# Patient Record
Sex: Male | Born: 1960 | Race: White | Hispanic: No | Marital: Married | Smoking: Current every day smoker
Health system: Southern US, Community
[De-identification: ages and names within clinical notes are randomized; demographics above are authoritative.]

---

## 2005-05-06 ENCOUNTER — Inpatient Hospital Stay (HOSPITAL_COMMUNITY): Admission: AC | Admit: 2005-05-06 | Discharge: 2005-05-08 | Payer: Self-pay

## 2011-02-11 ENCOUNTER — Inpatient Hospital Stay (INDEPENDENT_AMBULATORY_CARE_PROVIDER_SITE_OTHER)
Admission: RE | Admit: 2011-02-11 | Discharge: 2011-02-11 | Disposition: A | Discharge status: Home / Self Care | Payer: Commercial Managed Care - PPO | Source: Ambulatory Visit | Attending: Emergency Medicine | Admitting: Emergency Medicine

## 2011-02-11 ENCOUNTER — Ambulatory Visit (INDEPENDENT_AMBULATORY_CARE_PROVIDER_SITE_OTHER): Payer: Commercial Managed Care - PPO

## 2011-02-11 DIAGNOSIS — R109 Unspecified abdominal pain: Secondary | ICD-10-CM

## 2011-02-11 LAB — POCT URINALYSIS DIP (DEVICE)
Bilirubin Urine: NEGATIVE
Glucose, UA: NEGATIVE mg/dL
Ketones, ur: NEGATIVE mg/dL
Protein, ur: NEGATIVE mg/dL
Specific Gravity, Urine: <=1.005 (ref 1.005–1.030)
Urobilinogen, UA: 0.2 mg/dL (ref 0.0–1.0)
pH: 6.5 (ref 5.0–8.0)

## 2011-12-01 IMAGING — CR DG RIBS W/ CHEST 3+V*L*
4 series · 4 of 4 positions shown · non-contrast
Comparison: Chest x-ray 05/07/2005.

CLINICAL DATA: Chest pain.

LEFT RIBS AND CHEST - 3+ VIEW

[view not recorded (1 of 4)]
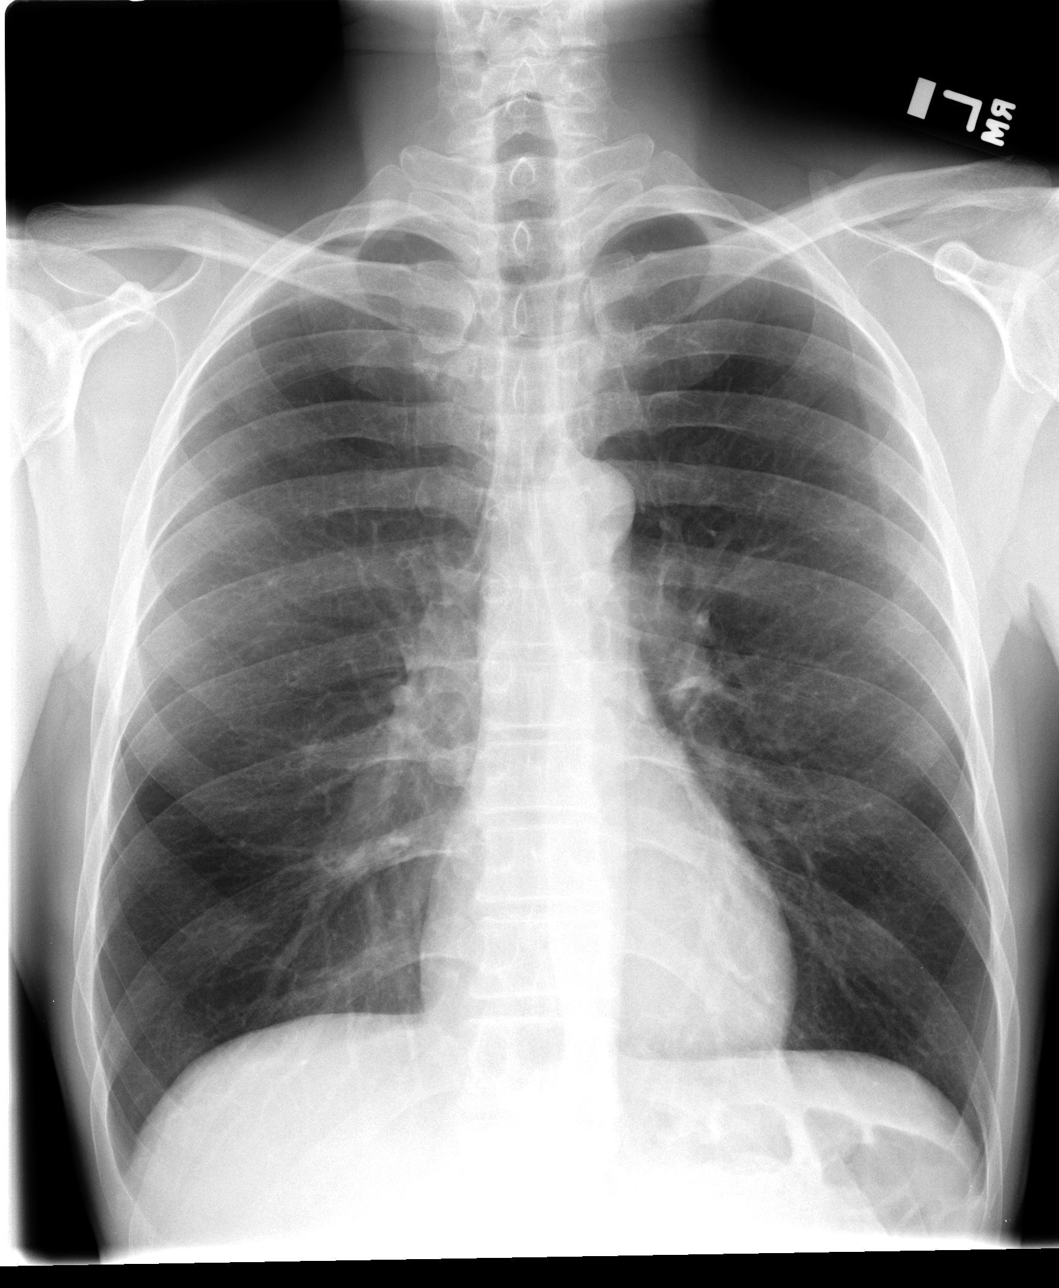

[view not recorded (2 of 4)]
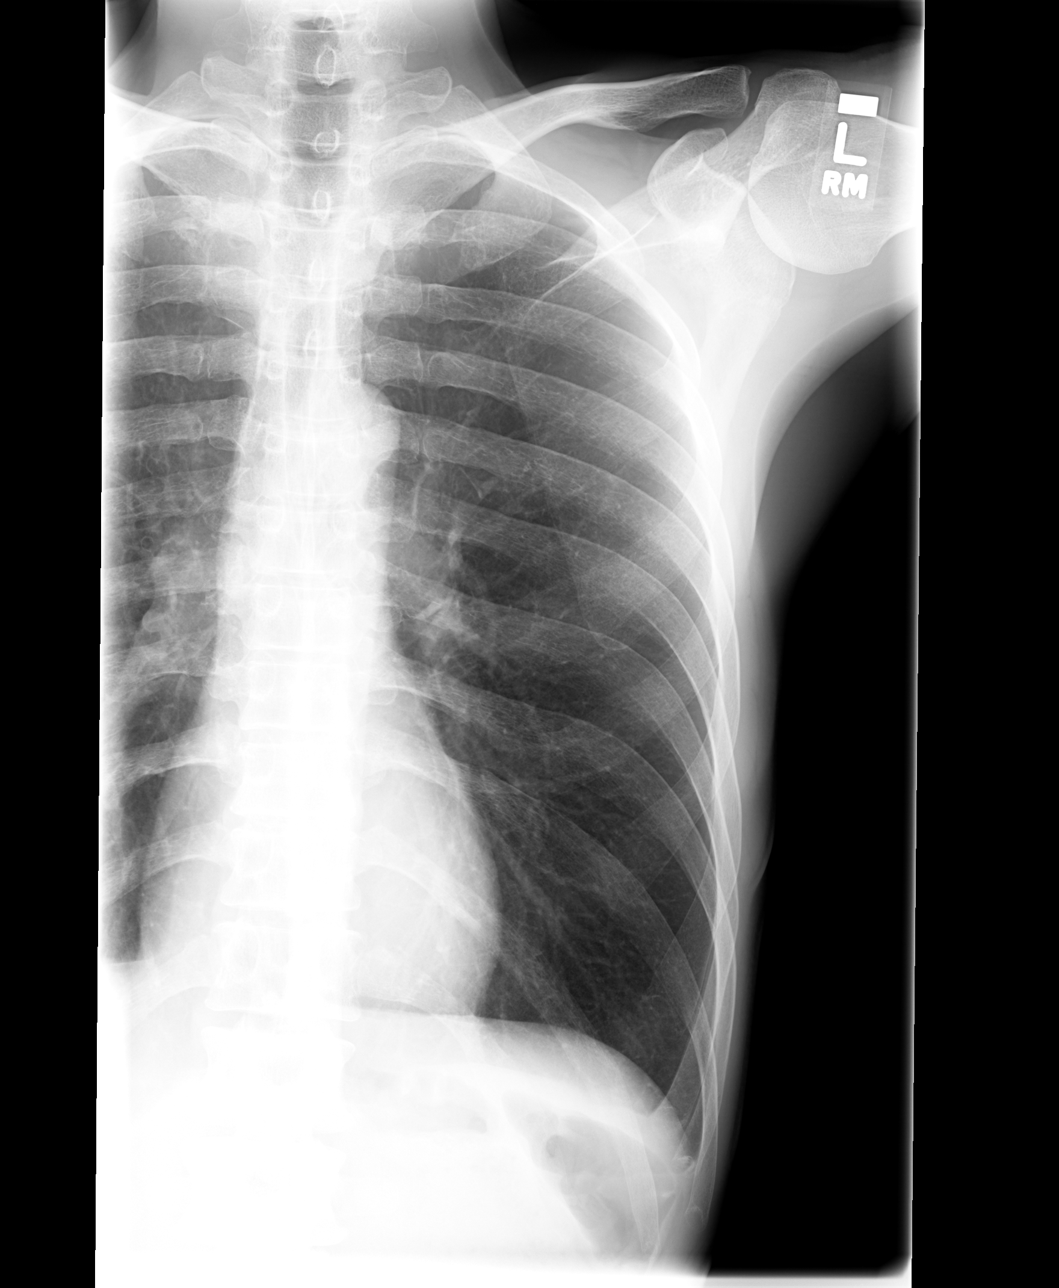

[view not recorded (3 of 4)]
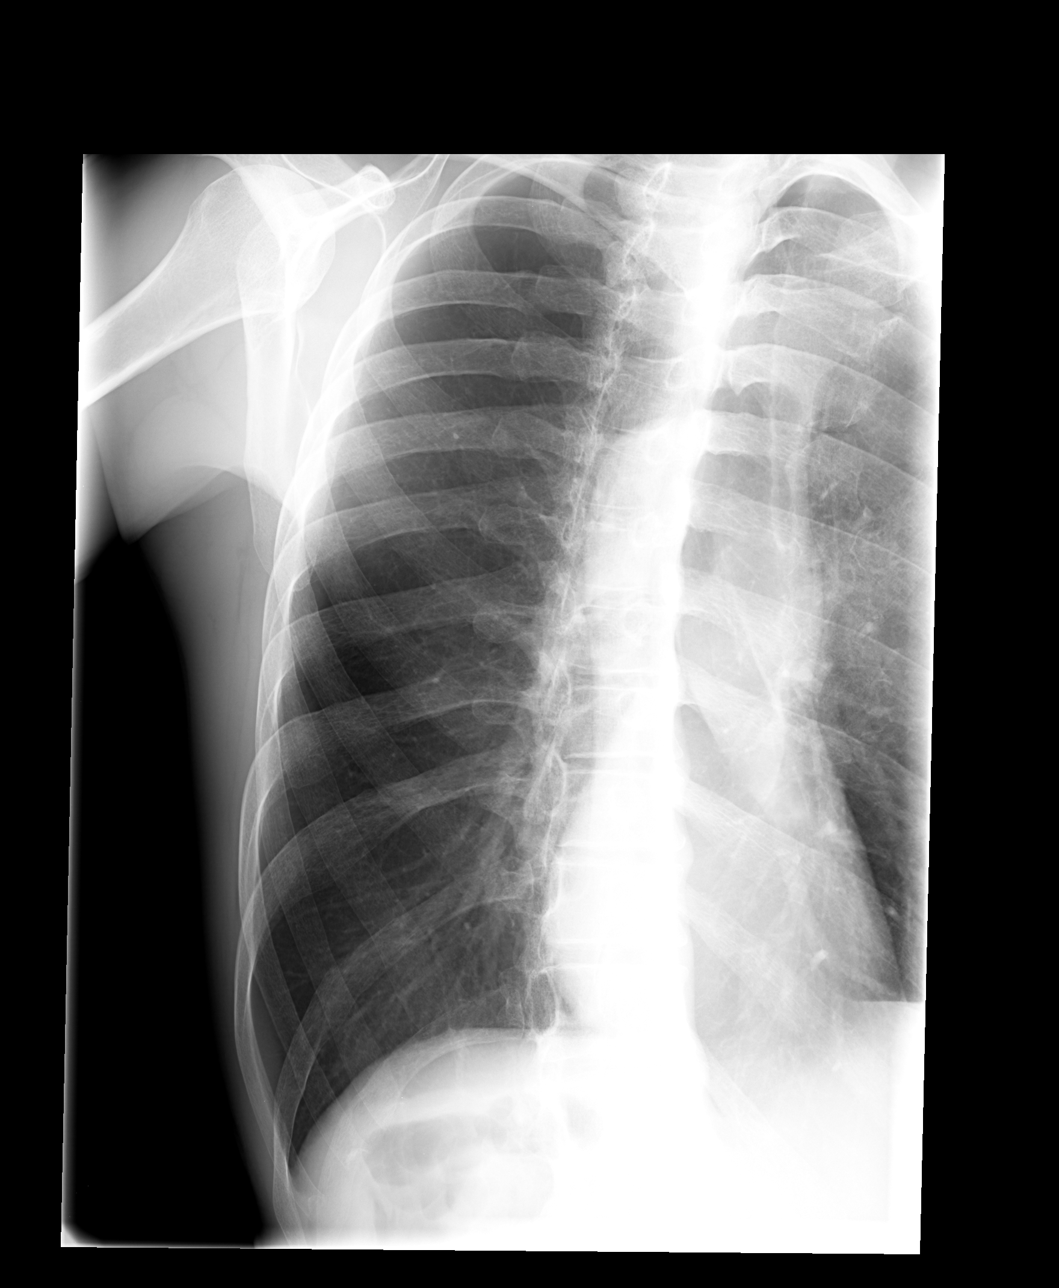

[view not recorded (4 of 4)]
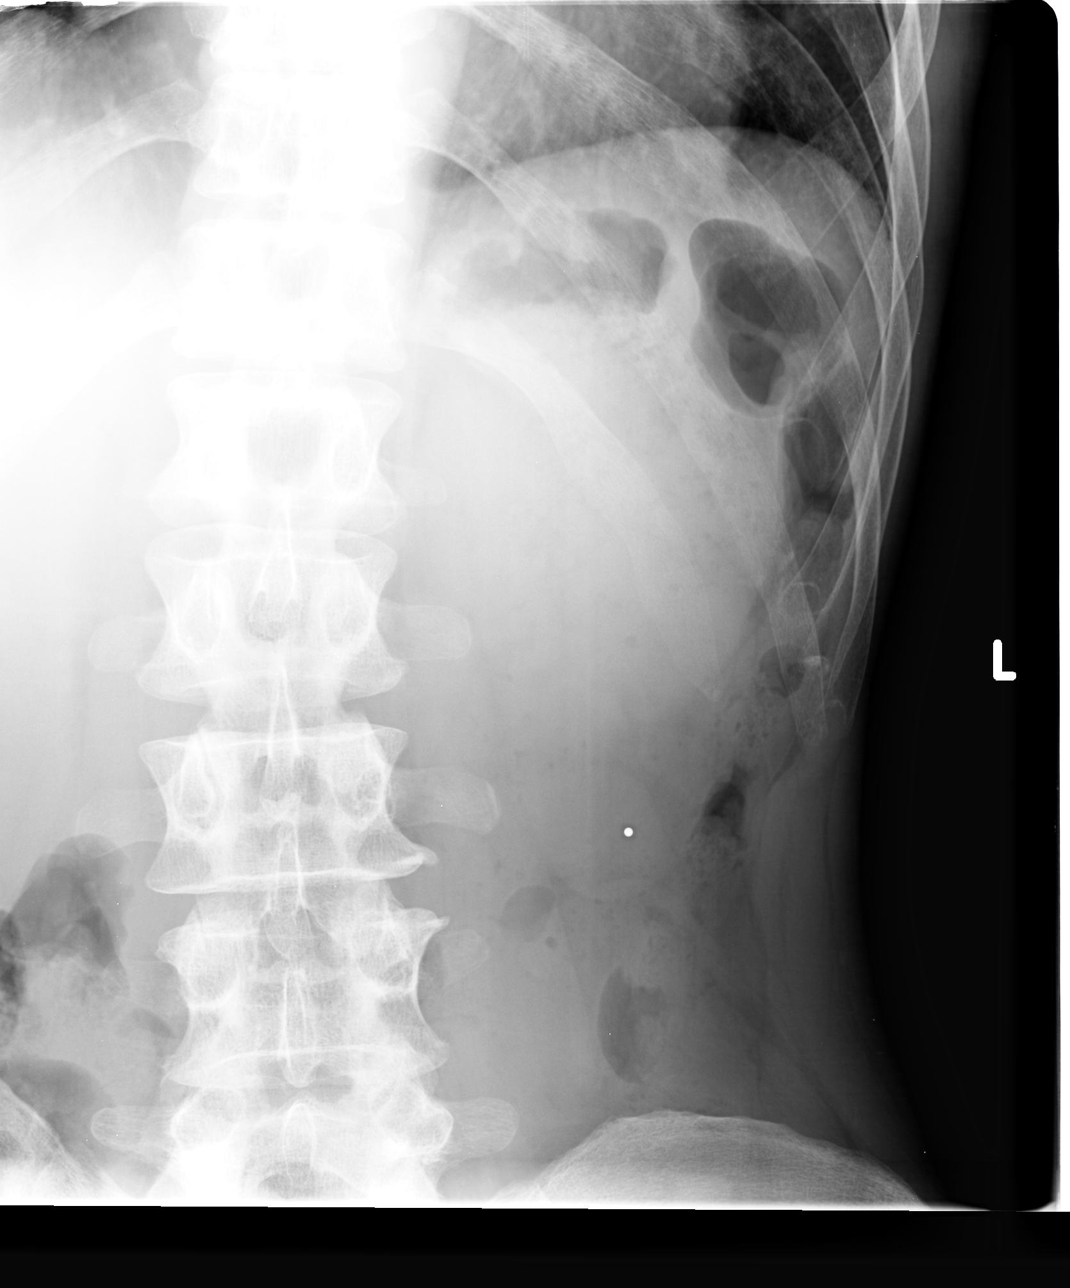

[4 of 4 positions shown; findings below may reference images not displayed]

FINDINGS: The cardiac silhouette, mediastinal and hilar contours
are within normal limits.  The lungs are clear.

Dedicated views of the left ribs demonstrate no definite acute rib
fractures.  No pleural thickening, pleural effusion or
pneumothorax.
IMPRESSION: 1.  No acute cardiopulmonary findings.
2.  No definite left-sided rib fractures.

## 2013-08-14 ENCOUNTER — Emergency Department (INDEPENDENT_AMBULATORY_CARE_PROVIDER_SITE_OTHER): Payer: PRIVATE HEALTH INSURANCE

## 2013-08-14 ENCOUNTER — Emergency Department (HOSPITAL_COMMUNITY)
Admission: EM | Admit: 2013-08-14 | Discharge: 2013-08-14 | Disposition: A | Discharge status: Home / Self Care | Payer: PRIVATE HEALTH INSURANCE | Source: Home / Self Care | Attending: Family Medicine | Admitting: Family Medicine

## 2013-08-14 ENCOUNTER — Encounter (HOSPITAL_COMMUNITY): Payer: Self-pay | Admitting: Emergency Medicine

## 2013-08-14 ENCOUNTER — Emergency Department (HOSPITAL_COMMUNITY): Payer: PRIVATE HEALTH INSURANCE

## 2013-08-14 DIAGNOSIS — J40 Bronchitis, not specified as acute or chronic: Secondary | ICD-10-CM

## 2013-08-14 MED ORDER — IPRATROPIUM-ALBUTEROL 0.5-2.5 (3) MG/3ML IN SOLN
3.0000 mL | Freq: Once | RESPIRATORY_TRACT | Status: AC
  Administered 2013-08-14: 3 mL via RESPIRATORY_TRACT

## 2013-08-14 MED ORDER — ALBUTEROL SULFATE HFA 108 (90 BASE) MCG/ACT IN AERS
INHALATION_SPRAY | RESPIRATORY_TRACT | Status: AC
  Filled 2013-08-14: qty 6.7

## 2013-08-14 MED ORDER — ACETAMINOPHEN 325 MG PO TABS
ORAL_TABLET | ORAL | Status: AC
  Filled 2013-08-14: qty 2

## 2013-08-14 MED ORDER — ACETAMINOPHEN 325 MG PO TABS
650.0000 mg | ORAL_TABLET | Freq: Once | ORAL | Status: AC
  Administered 2013-08-14: 650 mg via ORAL

## 2013-08-14 MED ORDER — ALBUTEROL SULFATE HFA 108 (90 BASE) MCG/ACT IN AERS: 2.0000 | INHALATION_SPRAY | Freq: Four times a day (QID) | RESPIRATORY_TRACT | Status: AC | PRN

## 2013-08-14 MED ORDER — GUAIFENESIN-CODEINE 100-10 MG/5ML PO SOLN: 5.0000 mL | Freq: Every evening | ORAL | Status: AC | PRN

## 2013-08-14 MED ORDER — IPRATROPIUM BROMIDE 0.06 % NA SOLN: 2.0000 | Freq: Four times a day (QID) | NASAL | Status: AC

## 2013-08-14 MED ORDER — IPRATROPIUM-ALBUTEROL 0.5-2.5 (3) MG/3ML IN SOLN
RESPIRATORY_TRACT | Status: AC
  Filled 2013-08-14: qty 3

## 2013-08-14 MED ORDER — ALBUTEROL SULFATE HFA 108 (90 BASE) MCG/ACT IN AERS
1.0000 | INHALATION_SPRAY | Freq: Four times a day (QID) | RESPIRATORY_TRACT | Status: DC | PRN
  Administered 2013-08-14: 2 via RESPIRATORY_TRACT

## 2013-08-14 MED ORDER — PREDNISONE 50 MG PO TABS: 50.0000 mg | ORAL_TABLET | Freq: Every day | ORAL | Status: AC

## 2013-08-14 NOTE — ED Notes (Signed)
Dr. Denyse Amassorey is in the room w/the pt C/o cold sxs onset yest w/sxs that include: fevers, scratchy throat, nauseas, HA, BA Denies: SOB, wheezing, d/v He is alert w/no signs of acute distress.

## 2013-08-14 NOTE — ED Provider Notes (Signed)
Isaac Knox is a 53 y.o. male who presents to Urgent Care today for one day of fever sore throat nausea headache cough and congestion. Patient also noted some moderate body aches. He notes positive sick contacts at work. He denies any significant shortness of breath or wheezing. He is a heavy smoker but denies any history of COPD or asthma. He's tried some over-the-counter medications which have helped a bit.   History reviewed. No pertinent past medical history. History  Substance Use Topics  . Smoking status: Current Every Day Smoker -- 1.00 packs/day    Types: Cigarettes  . Smokeless tobacco: Not on file  . Alcohol Use: Yes   ROS as above Medications: Current Facility-Administered Medications  Medication Dose Route Frequency Provider Last Rate Last Dose  . albuterol (PROVENTIL HFA;VENTOLIN HFA) 108 (90 BASE) MCG/ACT inhaler 1-2 puff  1-2 puff Inhalation Q6H PRN Rodolph BongEvan S Shadawn Hanaway, MD   2 puff at 08/14/13 1626   Current Outpatient Prescriptions  Medication Sig Dispense Refill  . albuterol (PROVENTIL HFA;VENTOLIN HFA) 108 (90 BASE) MCG/ACT inhaler Inhale 2 puffs into the lungs every 6 (six) hours as needed for wheezing or shortness of breath.  1 Inhaler  2  . guaiFENesin-codeine 100-10 MG/5ML syrup Take 5 mLs by mouth at bedtime as needed for cough.  120 mL  0  . ipratropium (ATROVENT) 0.06 % nasal spray Place 2 sprays into both nostrils 4 (four) times daily.  15 mL  1  . predniSONE (DELTASONE) 50 MG tablet Take 1 tablet (50 mg total) by mouth daily.  5 tablet  0    Exam:  BP 111/69  Pulse 90  Temp(Src) 98.5 F (36.9 C) (Oral)  Resp 18  SpO2 96% Gen: Well NAD HEENT: EOMI,  MMM Lungs: Normal work of breathing. Crackles right lower lobe clear otherwise Heart: RRR no MRG Abd: NABS, Soft. NT, ND Exts: Brisk capillary refill, warm and well perfused.   Patient was given a DuoNeb nebulizer treatment and had improvement in symptoms.  No results found for this or any previous visit  (from the past 24 hour(s)). Dg Chest 2 View  08/14/2013   CLINICAL DATA:  Cough and fever.  EXAM: CHEST  2 VIEW  COMPARISON:  01/2011  FINDINGS: Heart, mediastinum and hila are unremarkable.  Lungs are hyperexpanded but clear. No pleural effusion or pneumothorax.  The bony thorax is intact.  IMPRESSION: No acute cardiopulmonary disease. Stable appearance from the prior exam.   Electronically Signed   By: Amie Portlandavid  Ormond M.D.   On: 08/14/2013 16:06    Assessment and Plan: 53 y.o. male with bronchitis versus COPD exacerbation. Plan to treat with prednisone, codeine containing cough medication, and Atrovent nasal spray. Additionally we'll use an albuterol inhaler. Encourage patient to quit smoking. Followup with primary care provider.  Discussed warning signs or symptoms. Please see discharge instructions. Patient expresses understanding.    Rodolph BongEvan S Jayson Waterhouse, MD 08/14/13 [phone]

## 2013-08-14 NOTE — Discharge Instructions (Signed)
Thank you for coming in today. Take prednisone daily for 5 days. This is the most important medication.  Use the albuterol inhaler as needed for shortness of breath or wheezing. Take codeine containing cough medication especially at bedtime for cough suppression. Do not drive or work after taking this medicine.  Use the nasal spray as needed for runny nose or congestion.  Take up to 2 Aleve twice daily for pain fevers chills body aches or sore throat. Call or go to the emergency room if you get worse, have trouble breathing, have chest pains, or palpitations.  Followup with your Dr..  Please try to quit smoking  Bronchitis Bronchitis is inflammation of the airways that extend from the windpipe into the lungs (bronchi). The inflammation often causes mucus to develop, which leads to a cough. If the inflammation becomes severe, it may cause shortness of breath. CAUSES  Bronchitis may be caused by:   Viral infections.   Bacteria.   Cigarette smoke.   Allergens, pollutants, and other irritants.  SIGNS AND SYMPTOMS  The most common symptom of bronchitis is a frequent cough that produces mucus. Other symptoms include:  Fever.   Body aches.   Chest congestion.   Chills.   Shortness of breath.   Sore throat.  DIAGNOSIS  Bronchitis is usually diagnosed through a medical history and physical exam. Tests, such as chest X-rays, are sometimes done to rule out other conditions.  TREATMENT  You may need to avoid contact with whatever caused the problem (smoking, for example). Medicines are sometimes needed. These may include:  Antibiotics. These may be prescribed if the condition is caused by bacteria.  Cough suppressants. These may be prescribed for relief of cough symptoms.   Inhaled medicines. These may be prescribed to help open your airways and make it easier for you to breathe.   Steroid medicines. These may be prescribed for those with recurrent (chronic)  bronchitis. HOME CARE INSTRUCTIONS  Get plenty of rest.   Drink enough fluids to keep your urine clear or pale yellow (unless you have a medical condition that requires fluid restriction). Increasing fluids may help thin your secretions and will prevent dehydration.   Only take over-the-counter or prescription medicines as directed by your health care provider.  Only take antibiotics as directed. Make sure you finish them even if you start to feel better.  Avoid secondhand smoke, irritating chemicals, and strong fumes. These will make bronchitis worse. If you are a smoker, quit smoking. Consider using nicotine gum or skin patches to help control withdrawal symptoms. Quitting smoking will help your lungs heal faster.   Put a cool-mist humidifier in your bedroom at night to moisten the air. This may help loosen mucus. Change the water in the humidifier daily. You can also run the hot water in your shower and sit in the bathroom with the door closed for 5 10 minutes.   Follow up with your health care provider as directed.   Wash your hands frequently to avoid catching bronchitis again or spreading an infection to others.  SEEK MEDICAL CARE IF: Your symptoms do not improve after 1 week of treatment.  SEEK IMMEDIATE MEDICAL CARE IF:  Your fever increases.  You have chills.   You have chest pain.   You have worsening shortness of breath.   You have bloody sputum.  You faint.  You have lightheadedness.  You have a severe headache.   You vomit repeatedly. MAKE SURE YOU:   Understand these instructions.  Will watch your condition.  Will get help right away if you are not doing well or get worse. Document Released: 07/11/2005 Document Revised: 05/01/2013 Document Reviewed: 03/05/2013 [facility] Patient Information 2014 [place], [place].

## 2014-06-03 IMAGING — CR DG CHEST 2V
3 series · 3 of 3 positions shown · non-contrast
Comparison: [DATE]

CLINICAL DATA: Cough and fever.

EXAM:
CHEST  2 VIEW

[view not recorded (1 of 3)]
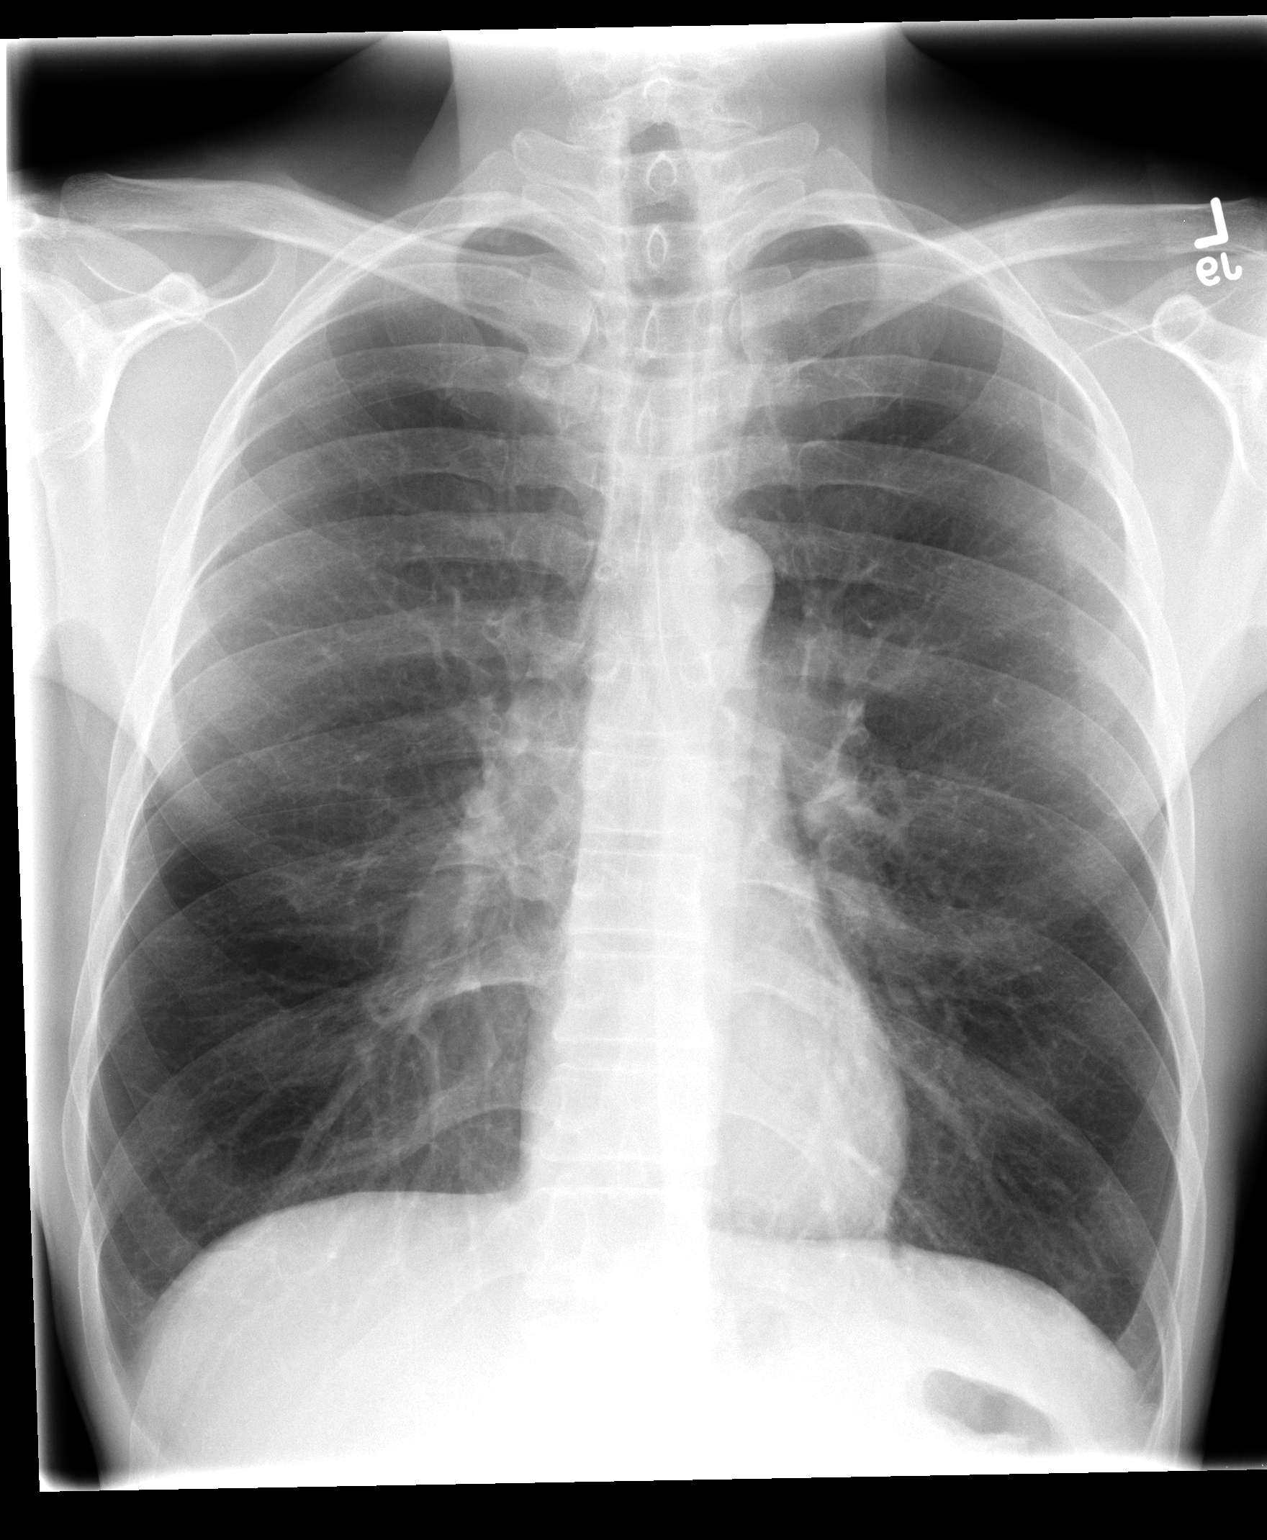

[view not recorded (2 of 3)]
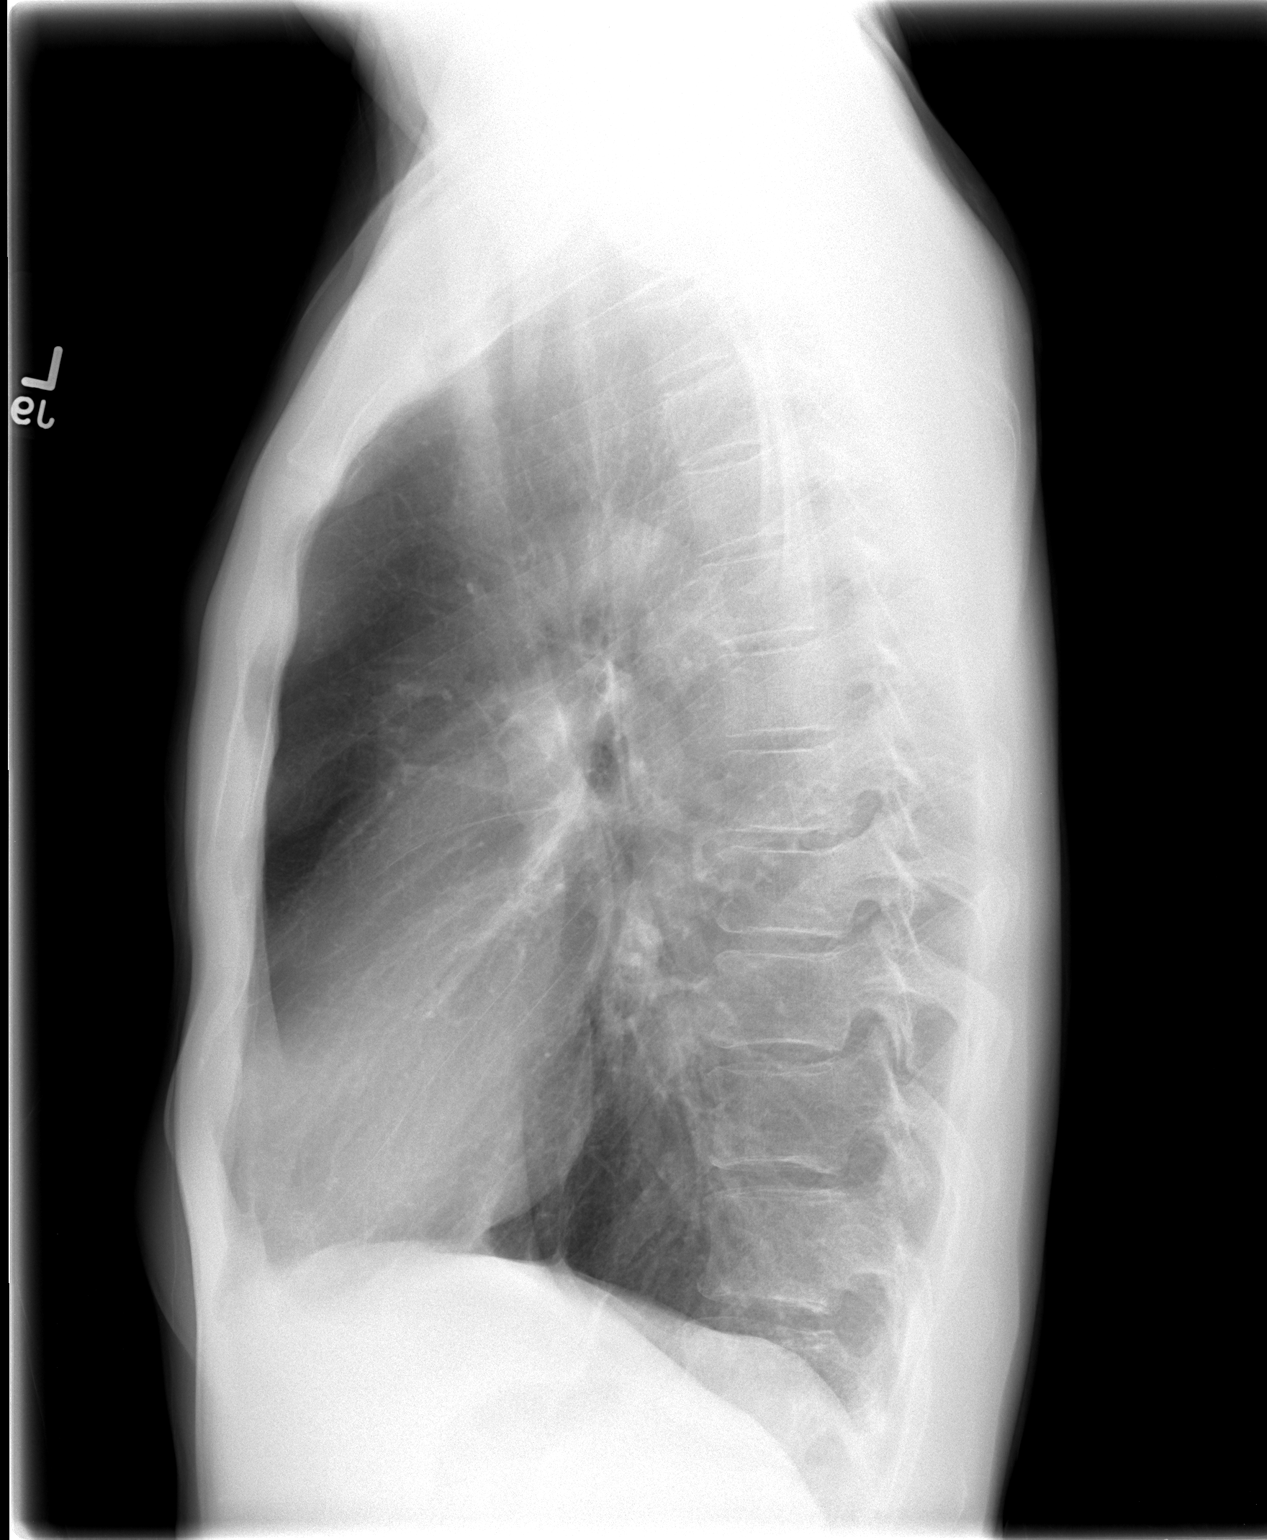

[view not recorded (3 of 3)]
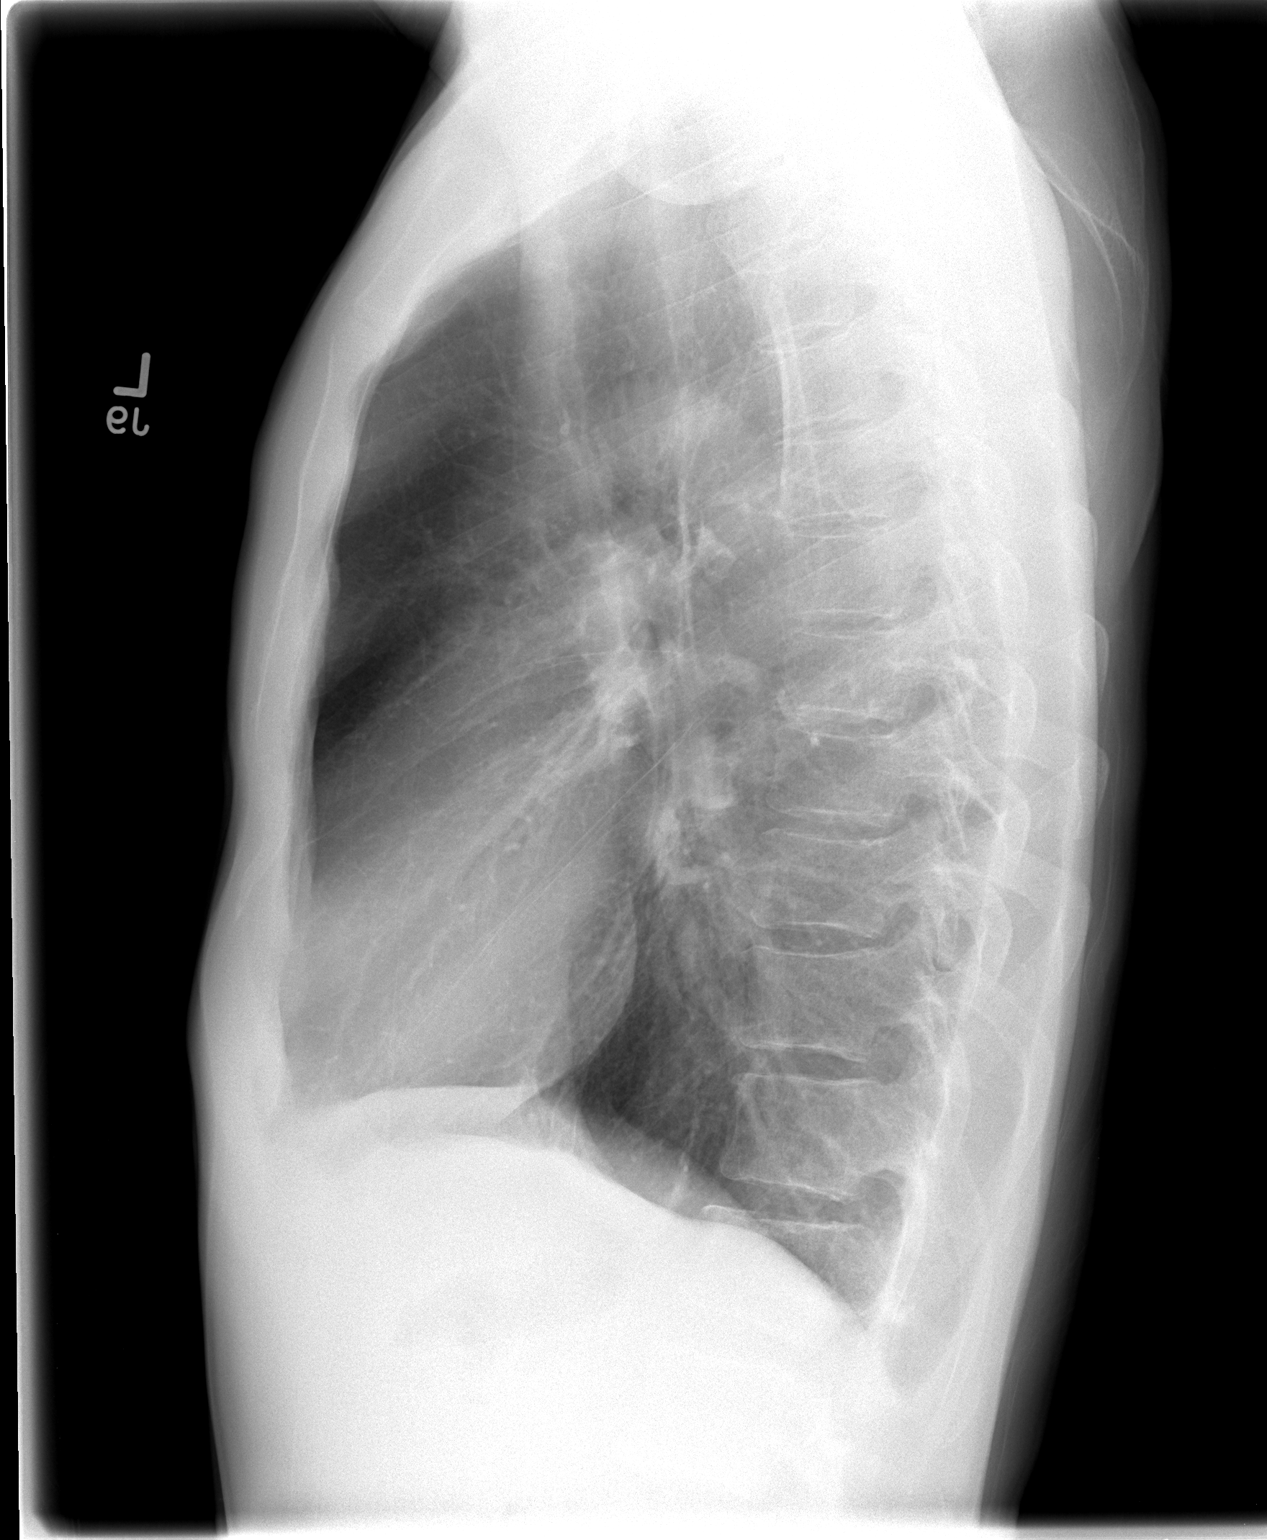

[3 of 3 positions shown; findings below may reference images not displayed]

FINDINGS: Heart, mediastinum and hila are unremarkable.

Lungs are hyperexpanded but clear. No pleural effusion or
pneumothorax.

The bony thorax is intact.
IMPRESSION: No acute cardiopulmonary disease. Stable appearance from the prior
exam.

## 2021-08-20 ENCOUNTER — Other Ambulatory Visit: Payer: Self-pay | Admitting: Physician Assistant

## 2021-08-20 DIAGNOSIS — I861 Scrotal varices: Secondary | ICD-10-CM

## 2021-09-08 ENCOUNTER — Ambulatory Visit
Admission: RE | Admit: 2021-09-08 | Discharge: 2021-09-08 | Disposition: A | Discharge status: Home / Self Care | Payer: PRIVATE HEALTH INSURANCE | Source: Ambulatory Visit | Attending: Physician Assistant | Admitting: Physician Assistant

## 2021-09-08 DIAGNOSIS — I861 Scrotal varices: Secondary | ICD-10-CM
